# Patient Record
Sex: Female | Born: 2000 | Race: White | Hispanic: No | Marital: Single | Smoking: Never smoker
Health system: Southern US, Community
[De-identification: ages and names within clinical notes are randomized; demographics above are authoritative.]

## PROBLEM LIST (undated history)

## (undated) ENCOUNTER — Ambulatory Visit: Disposition: A | Discharge status: Home / Self Care | Payer: Self-pay

## (undated) DIAGNOSIS — Z803 Family history of malignant neoplasm of breast: Secondary | ICD-10-CM

## (undated) DIAGNOSIS — Z800 Family history of malignant neoplasm of digestive organs: Secondary | ICD-10-CM

## (undated) DIAGNOSIS — Z91018 Allergy to other foods: Secondary | ICD-10-CM

## (undated) DIAGNOSIS — Z808 Family history of malignant neoplasm of other organs or systems: Secondary | ICD-10-CM

## (undated) HISTORY — DX: Family history of malignant neoplasm of other organs or systems: Z80.8

## (undated) HISTORY — DX: Family history of malignant neoplasm of breast: Z80.3

## (undated) HISTORY — DX: Family history of malignant neoplasm of digestive organs: Z80.0

---
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
## 2020-06-19 ENCOUNTER — Encounter (INDEPENDENT_AMBULATORY_CARE_PROVIDER_SITE_OTHER): Payer: Self-pay

## 2021-01-13 ENCOUNTER — Other Ambulatory Visit: Payer: Self-pay | Admitting: Family Medicine

## 2021-01-13 DIAGNOSIS — N6310 Unspecified lump in the right breast, unspecified quadrant: Secondary | ICD-10-CM

## 2021-01-14 ENCOUNTER — Encounter: Payer: Self-pay | Admitting: Oncology

## 2021-02-04 ENCOUNTER — Inpatient Hospital Stay: Payer: PPO | Attending: Genetic Counselor | Admitting: Genetic Counselor

## 2021-02-04 ENCOUNTER — Other Ambulatory Visit: Payer: Self-pay | Admitting: Genetic Counselor

## 2021-02-04 ENCOUNTER — Other Ambulatory Visit: Payer: Self-pay

## 2021-02-04 ENCOUNTER — Inpatient Hospital Stay: Payer: PPO

## 2021-02-04 ENCOUNTER — Encounter: Payer: Self-pay | Admitting: Genetic Counselor

## 2021-02-04 DIAGNOSIS — Z803 Family history of malignant neoplasm of breast: Secondary | ICD-10-CM | POA: Diagnosis not present

## 2021-02-04 DIAGNOSIS — Z800 Family history of malignant neoplasm of digestive organs: Secondary | ICD-10-CM | POA: Diagnosis not present

## 2021-02-04 DIAGNOSIS — Z808 Family history of malignant neoplasm of other organs or systems: Secondary | ICD-10-CM | POA: Diagnosis not present

## 2021-02-04 NOTE — Progress Notes (Addendum)
REFERRING PROVIDER: Health, [facility] [address] [place],  [place] [address]  PRIMARY PROVIDER:  Health, [facility]  PRIMARY REASON FOR VISIT:  1. Family history of breast cancer   2. Family history of colon cancer   3. Family history of brain cancer      HISTORY OF PRESENT ILLNESS:   Toni Marks, a 20 y.o. 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  Toni Marks presents to clinic today to discuss the possibility of a hereditary predisposition to cancer, genetic testing, and to further clarify her future cancer risks, as well as potential cancer risks for family members.   Toni Marks is a 20 y.o. female with no personal history of cancer.  She felt a lump in her breast this year, and an ultrasound was performed.  It was determined to be a benign lump, but this concerned her enough to get tested.  CANCER HISTORY:  Oncology History   No history exists.     RISK FACTORS:  Menarche was at age 2.  First live birth at age N/A.  OCP use for approximately 0 years.  Ovaries intact: yes.  Hysterectomy: no.  Menopausal status: premenopausal.  HRT use: 0 years. Colonoscopy: no; not examined. Mammogram within the last year: no. Number of breast biopsies: 0. Up to date with pelvic exams: no. Any excessive radiation exposure in the past: no  Past Medical History:  Diagnosis Date   Family history of brain cancer    Family history of breast cancer    Family history of colon cancer    Multiple food allergies     No past surgical history on file.  Social History   Socioeconomic History   Marital status: Single    Spouse name: Not on file   Number of children: Not on file   Years of education: Not on file   Highest education level: Not on file  Occupational History   Not on file  Tobacco  Use   Smoking status: Never   Smokeless tobacco: Never  Substance and Sexual Activity   Alcohol use: No   Drug use: No   Sexual activity: Not on file  Other Topics Concern   Not on file  Social History Narrative   Not on file   Social Determinants of Health   Financial Resource Strain: Not on file  Food Insecurity: Not on file  Transportation Needs: Not on file  Physical Activity: Not on file  Stress: Not on file  Social Connections: Not on file     FAMILY HISTORY:  We obtained a detailed, 4-generation family history.  Significant diagnoses are listed below: Family History  Problem Relation Age of Onset   Breast cancer Mother 75       second cancer at 13   BRCA 1/2 Mother        BRCA2 positive   Asthma Sister    Brain cancer Sister 64   Heart disease Maternal Grandmother    Hypertension Maternal Grandmother    Hyperlipidemia Maternal Grandmother    Heart disease Maternal Grandfather    Hypertension Maternal Grandfather    Hyperlipidemia Maternal Grandfather    Colon cancer Maternal Grandfather 76   Diabetes Paternal Grandmother    Heart disease Paternal Grandmother    Lung cancer Paternal Grandmother    Heart disease Paternal  Grandfather     The patient does not have children.  She has two sisters and a brother. One sister had a cancer in her hypothalamus.  Both parents are living.  The patient's mother had breast cancer at 67 and 70.  She tested positive for a BRCA2 mutation.  She has a brother and sister set of twins who are cancer free.  The maternal grandmother is living and the grandfather is deceased from colon cancer.  The patient's father is living.  He has four sisters and a brother who are cancer free. The paternal grandparents are deceased. The grandmother had lung cancer and the grandfather died in his 56's.  Toni Marks is aware of previous family history of genetic testing for hereditary cancer risks. Patient's maternal ancestors are of Caucasian  descent, and paternal ancestors are of [place] descent. There is no reported Ashkenazi Jewish ancestry. There is no known consanguinity.  GENETIC COUNSELING ASSESSMENT: Toni Marks is a 20 y.o. female with a family history of breast cancer which is somewhat suggestive of a hereditary cancer syndrome and predisposition to cancer given the young age of onset of breast cancer and the known BRCA2 mutation. We, therefore, discussed and recommended the following at today's visit.   DISCUSSION: We discussed that, in general, most cancer is not inherited in families, but instead is sporadic or familial. Sporadic cancers occur by chance and typically happen at older ages (>50 years) as this type of cancer is caused by genetic changes acquired during an individuals lifetime. Some families have more cancers than would be expected by chance; however, the ages or types of cancer are not consistent with a known genetic mutation or known genetic mutations have been ruled out. This type of familial cancer is thought to be due to a combination of multiple genetic, environmental, hormonal, and lifestyle factors. While this combination of factors likely increases the risk of cancer, the exact source of this risk is not currently identifiable or testable.  We discussed that 5 - 10% of breast cancer is hereditary, with most cases associated with BRCA mutations.  The patient's mother has a BRCA2 mutation, therefore the patient has a 50% chance of testing positive.  We discussed that we can offer testing for hereditary genes associated with young brain tumors such as that found in her sister.  We discussed that testing is beneficial for several reasons including knowing how to follow individuals who test positive, and understand if other family members could be at risk for cancer and allow them to undergo genetic testing.   We reviewed the characteristics, features and inheritance patterns of hereditary cancer syndromes. We  also discussed genetic testing, including the appropriate family members to test, the process of testing, insurance coverage and turn-around-time for results. We discussed the implications of a negative, positive, carrier and/or variant of uncertain significant result. We recommended Ms. Wiedeman pursue genetic testing for the Multi-cancer gene panel.   The Multi-Gene Panel offered by [facility] includes sequencing and/or deletion duplication testing of the following 84 genes: AIP, ALK, APC, ATM, AXIN2,BAP1,  BARD1, BLM, BMPR1A, BRCA1, BRCA2, BRIP1, CASR, CDC73, CDH1, CDK4, CDKN1B, CDKN1C, CDKN2A (p14ARF), CDKN2A (p16INK4a), CEBPA, CHEK2, CTNNA1, DICER1, DIS3L2, EGFR (c.2369C>T, p.Thr790Met variant only), EPCAM (Deletion/duplication testing only), FH, FLCN, GATA2, GPC3, GREM1 (Promoter region deletion/duplication testing only), HOXB13 (c.251G>A, p.Gly84Glu), HRAS, KIT, MAX, MEN1, MET, MITF (c.952G>A, p.Glu318Lys variant only), MLH1, MSH2, MSH3, MSH6, MUTYH, NBN, NF1, NF2, NTHL1, PALB2, PDGFRA, PHOX2B, PMS2, POLD1, POLE, POT1, PRKAR1A, PTCH1, PTEN, RAD50, RAD51C,  RAD51D, RB1, RECQL4, RET, RUNX1, SDHAF2, SDHA (sequence changes only), SDHB, SDHC, SDHD, SMAD4, SMARCA4, SMARCB1, SMARCE1, STK11, SUFU, TERC, TERT, TMEM127, TP53, TSC1, TSC2, VHL, WRN and WT1.    We discussed that some people do not want to undergo genetic testing due to fear of genetic discrimination.  A federal law called the Genetic Information Non-Discrimination Act (GINA) of 2008 helps protect individuals against genetic discrimination based on their genetic test results.  It impacts both health insurance and employment.  With health insurance, it protects against increased premiums, being kicked off insurance or being forced to take a test in order to be insured.  For employment it protects against hiring, firing and promoting decisions based on genetic test results.  Health status due to a cancer diagnosis is not protected under GINA.   Based on  Ms. Barstow's family history of cancer, she meets medical criteria for genetic testing. Despite that she meets criteria, she may still have an out of pocket cost. We discussed that if her out of pocket cost for testing is over $100, the laboratory will call and confirm whether she wants to proceed with testing.  If the out of pocket cost of testing is less than $100 she will be billed by the genetic testing laboratory.   PLAN: After considering the risks, benefits, and limitations, Ms. Mastrianni provided informed consent to pursue genetic testing and the blood sample was sent to [facility] for analysis of the Multi-cancer panel. Results should be available within approximately 2-3 weeks' time, at which point they will be disclosed by telephone to Ms. Vanhorne, as will any additional recommendations warranted by these results. Ms. Yeung will receive a summary of her genetic counseling visit and a copy of her results once available. This information will also be available in Epic.   Lastly, we encouraged Ms. Youkhana to remain in contact with cancer genetics annually so that we can continuously update the family history and inform her of any changes in cancer genetics and testing that may be of benefit for this family.   Ms. Kinkade questions were answered to her satisfaction today. Our contact information was provided should additional questions or concerns arise. Thank you for the referral and allowing [place] to share in the care of your patient.   Tasmine Hipwell P. Florene Glen, [place], [facility] Licensed, Insurance risk surveyor [email or web address] phone: [phone]  The patient was seen for a total of 30 minutes in face-to-face genetic counseling.  The patient was seen alone.  This patient was discussed with Drs. Magrinat, Lindi Adie and/or Burr Medico who agrees with the above.    _______________________________________________________________________ For Office Staff:  Number of people  involved in session: 1 Was an Intern/ student involved with case: no

## 2021-02-05 LAB — GENETIC SCREENING ORDER

## 2021-03-12 ENCOUNTER — Encounter: Payer: Self-pay | Admitting: Genetic Counselor

## 2021-03-12 ENCOUNTER — Telehealth: Payer: Self-pay | Admitting: Genetic Counselor

## 2021-03-12 ENCOUNTER — Ambulatory Visit: Payer: Self-pay | Admitting: Genetic Counselor

## 2021-03-12 DIAGNOSIS — Z1379 Encounter for other screening for genetic and chromosomal anomalies: Secondary | ICD-10-CM | POA: Insufficient documentation

## 2021-03-12 NOTE — Progress Notes (Signed)
HPI:  [name] was previously seen in the [facility] clinic due to a family history of cancer, a known BRCA2 mutation and concerns regarding a hereditary predisposition to cancer. Please refer to our prior cancer genetics clinic note for more information regarding our discussion, assessment and recommendations, at the time. [name] recent genetic test results were disclosed to her, as were recommendations warranted by these results. These results and recommendations are discussed in more detail below.  CANCER HISTORY:  Oncology History   No history exists.    FAMILY HISTORY:  We obtained a detailed, 4-generation family history.  Significant diagnoses are listed below: Family History  Problem Relation Age of Onset   Breast cancer Mother 59       second cancer at 50   BRCA 1/2 Mother        BRCA2 positive   Asthma Sister    Brain cancer Sister 49   Heart disease Maternal Grandmother    Hypertension Maternal Grandmother    Hyperlipidemia Maternal Grandmother    Heart disease Maternal Grandfather    Hypertension Maternal Grandfather    Hyperlipidemia Maternal Grandfather    Colon cancer Maternal Grandfather 76   Diabetes Paternal Grandmother    Heart disease Paternal Grandmother    Lung cancer Paternal Grandmother    Heart disease Paternal Grandfather       The patient does not have children.  She has two sisters and a brother. One sister had a cancer in her hypothalamus.  Both parents are living.   The patient's mother had breast cancer at 28 and 39.  She tested positive for a BRCA2 mutation.  She has a brother and sister set of twins who are cancer free.  The maternal grandmother is living and the grandfather is deceased from colon cancer.   The patient's father is living.  He has four sisters and a brother who are cancer free. The paternal grandparents are deceased. The grandmother had lung cancer and the grandfather died in his 31's.   [name] is  aware of previous family history of genetic testing for hereditary cancer risks. Patient's maternal ancestors are of Caucasian descent, and paternal ancestors are of [place] descent. There is no reported Ashkenazi Jewish ancestry. There is no known consanguinity  GENETIC TEST RESULTS: Genetic testing reported out on March 12, 2021 through the multi-cancer panel+RNA found no pathogenic mutations. [name] test was normal and did not reveal the familial mutation. We call this result a true negative result because the cancer-causing mutation was identified in [name]'s family, and she did not inherit it.  Given this negative result, [name] chances of developing BRCA2-related cancers are the same as they are in the general population.  The Multi-Gene Panel offered by [facility] includes sequencing and/or deletion duplication testing of the following 84 genes: AIP, ALK, APC, ATM, AXIN2,BAP1,  BARD1, BLM, BMPR1A, BRCA1, BRCA2, BRIP1, CASR, CDC73, CDH1, CDK4, CDKN1B, CDKN1C, CDKN2A (p14ARF), CDKN2A (p16INK4a), CEBPA, CHEK2, CTNNA1, DICER1, DIS3L2, EGFR (c.2369C>T, p.Thr790Met variant only), EPCAM (Deletion/duplication testing only), FH, FLCN, GATA2, GPC3, GREM1 (Promoter region deletion/duplication testing only), HOXB13 (c.251G>A, p.Gly84Glu), HRAS, KIT, MAX, MEN1, MET, MITF (c.952G>A, p.Glu318Lys variant only), MLH1, MSH2, MSH3, MSH6, MUTYH, NBN, NF1, NF2, NTHL1, PALB2, PDGFRA, PHOX2B, PMS2, POLD1, POLE, POT1, PRKAR1A, PTCH1, PTEN, RAD50, RAD51C, RAD51D, RB1, RECQL4, RET, RUNX1, SDHAF2, SDHA (sequence changes only), SDHB, SDHC, SDHD, SMAD4, SMARCA4, SMARCB1, SMARCE1, STK11, SUFU, TERC, TERT, TMEM127, TP53, TSC1, TSC2, VHL, WRN and WT1. The test report  has been scanned into EPIC and is located under the Molecular Pathology section of the Results Review tab.  A portion of the result report is included below for reference.     We discussed with [name] that because current genetic testing is not  perfect, it is possible there may be a gene mutation in one of these genes that current testing cannot detect, but that chance is small.  We also discussed, that there could be another gene that has not yet been discovered, or that we have not yet tested, that is responsible for the cancer diagnoses in the family. It is also possible there is a hereditary cause for the cancer in the family that [name] did not inherit and therefore was not identified in her testing.  Therefore, it is important to remain in touch with cancer genetics in the future so that we can continue to offer [name] the most up to date genetic testing.   Genetic testing did identify five Variants of uncertain significance (VUS) - one in the DIS3L2 gene called Gain (Exons 15-21) CN=3, a second in the DIS3L2 gene called c.2221G>A, a third in the MSH2 gene called c.2050G>T, a fourth in the SMARCB1 gene called GAIN (Exons 8-9) CN=3, and a fifth in the TMEM127 gene called c.121A>G.  At this time, it is unknown if these variants are associated with increased cancer risk or if they are normal findings, but most variants such as these get reclassified to being inconsequential. They should not be used to make medical management decisions. With time, we suspect the lab will determine the significance of these variants, if any. If we do learn more about them, we will try to contact [name] to discuss it further. However, it is important to stay in touch with [place] periodically and keep the address and phone number up to date.  ADDITIONAL GENETIC TESTING: We discussed with [name] that her genetic testing was fairly extensive.  If there are genes identified to increase cancer risk that can be analyzed in the future, we would be happy to discuss and coordinate this testing at that time.    CANCER SCREENING RECOMMENDATIONS: [name] test result is considered negative (normal).  This means that we have not identified a hereditary  cause for her family history of cancer at this time. Most cancers happen by chance and this negative test suggests that her cancer may fall into this category.    While reassuring, this does not definitively rule out a hereditary predisposition to cancer. It is still possible that there could be genetic mutations that are undetectable by current technology. There could be genetic mutations in genes that have not been tested or identified to increase cancer risk.  Therefore, it is recommended she continue to follow the cancer management and screening guidelines provided by her primary healthcare provider.   An individual's cancer risk and medical management are not determined by genetic test results alone. Overall cancer risk assessment incorporates additional factors, including personal medical history, family history, and any available genetic information that may result in a personalized plan for cancer prevention and surveillance  RECOMMENDATIONS FOR FAMILY MEMBERS:  Individuals in this family might be at some increased risk of developing cancer, over the general population risk, simply due to the family history of cancer.  We recommended women in this family have a yearly mammogram beginning at age 78, or 5 years younger than the earliest onset of cancer, an annual clinical breast exam,  and perform monthly breast self-exams. Women in this family should also have a gynecological exam as recommended by their primary provider. All family members should be referred for colonoscopy starting at age 58.  FOLLOW-UP: Lastly, we discussed with [name] that cancer genetics is a rapidly advancing field and it is possible that new genetic tests will be appropriate for her and/or her family members in the future. We encouraged her to remain in contact with cancer genetics on an annual basis so we can update her personal and family histories and let her know of advances in cancer genetics that may benefit this  family.   Our contact number was provided. [name] questions were answered to her satisfaction, and she knows she is welcome to call us at anytime with additional questions or concerns.   Roma Kayser, [place], [facility] Licensed, Certified Genetic Counselor [email or web address]

## 2021-03-12 NOTE — Telephone Encounter (Signed)
Revealed results to patient.  She does NOT have the BRCA2 pathogenic variant identified in her mother.  This is considered a true negative, as there is a known mutation in the family and she does not have it.  There are 5 VUS's.  These will not affect medical management.

## 2021-03-30 IMAGING — DX PORTABLE CHEST - 1 VIEW
1 series · 1 of 1 positions shown · non-contrast
Comparison: March 08, 2014

CLINICAL DATA: Seizure

EXAM:
PORTABLE CHEST 1 VIEW

[chest ap]
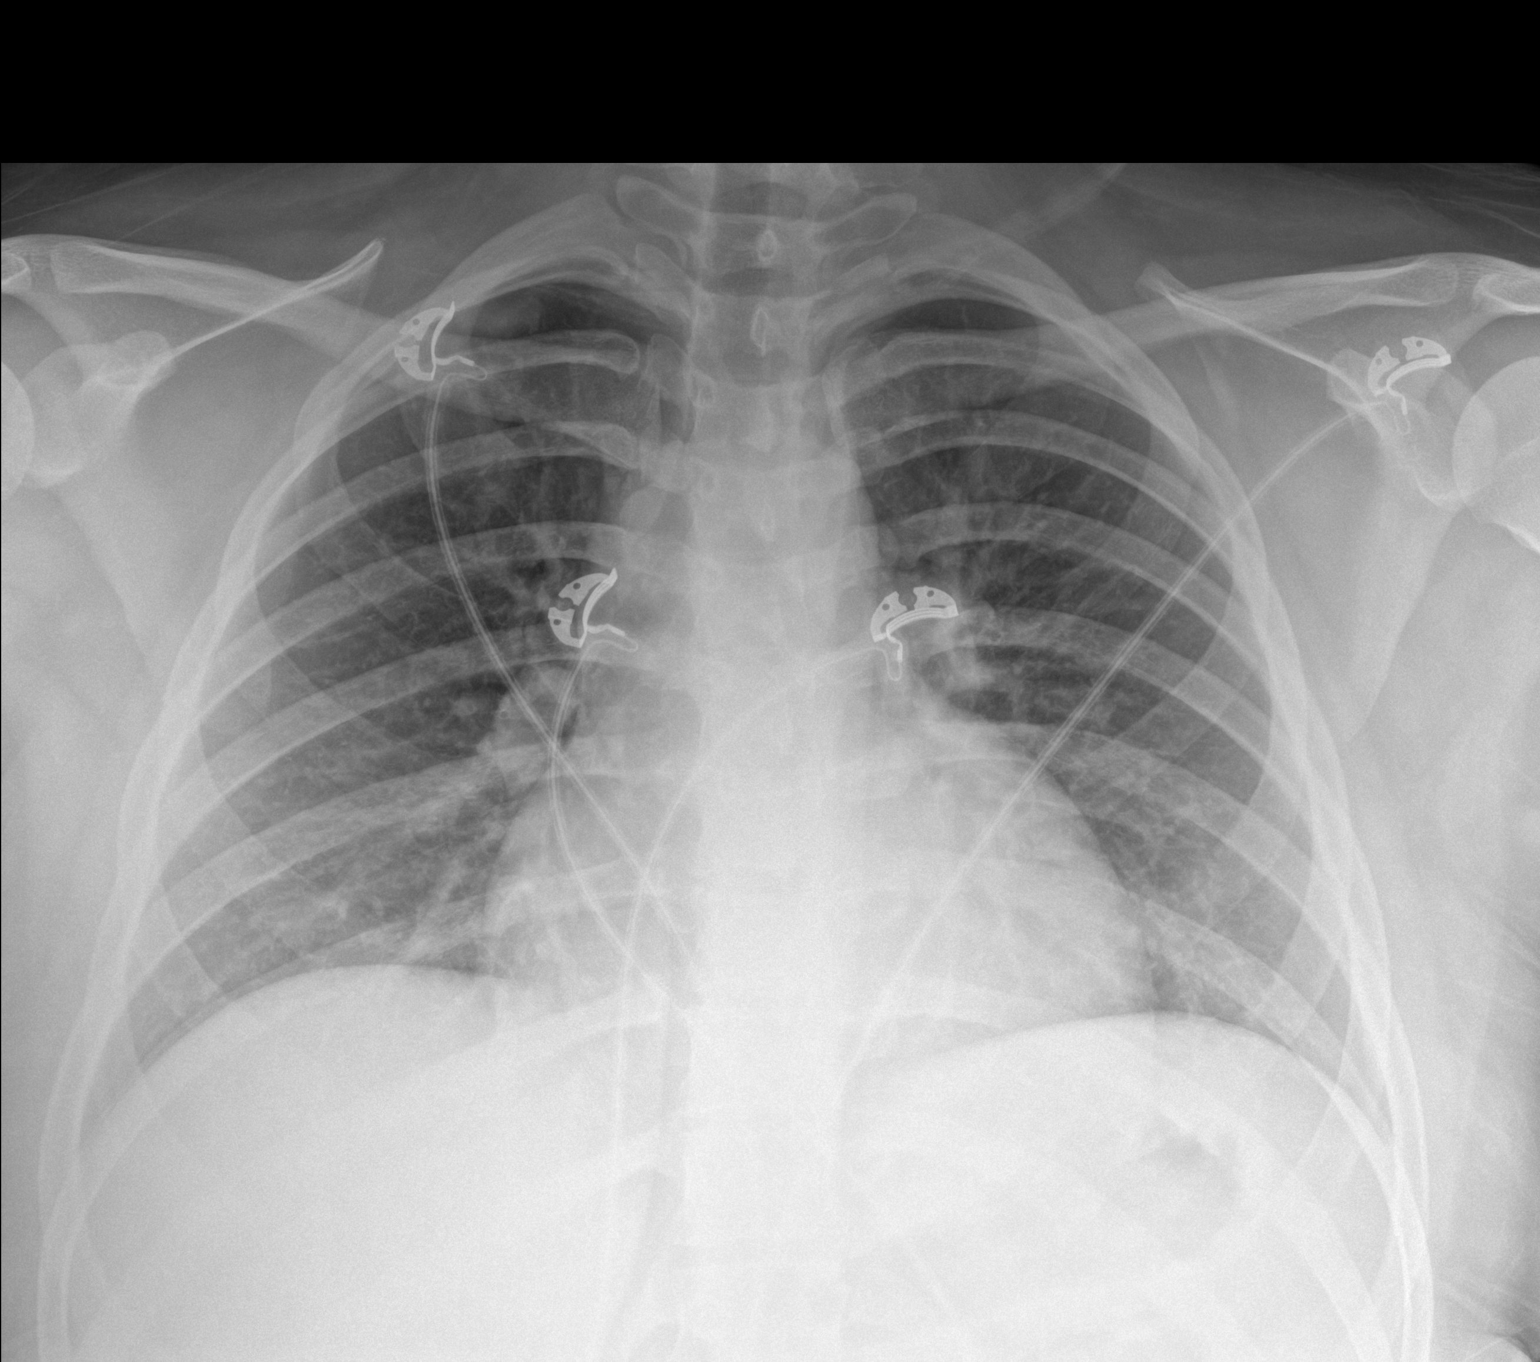

[1 of 1 positions shown; findings below may reference images not displayed]

FINDINGS: The heart size is mildly enlarged. There is no pneumothorax. No
large pleural effusion. There is no focal infiltrate.
IMPRESSION: No active disease.  There is mild cardiac enlargement

## 2021-03-30 IMAGING — CT CT HEAD WITHOUT CONTRAST
4 of 5 series · 17 of 47 positions shown, 19 images · non-contrast
Comparison: None.

CLINICAL DATA: Seizure, new, nontraumatic, 18-40 yrs

EXAM:
CT HEAD WITHOUT CONTRAST
TECHNIQUE: Contiguous axial images were obtained from the base of the skull
through the vertex without intravenous contrast.

[Series 2: head bone · axial · 0.41mm/px · z∈[-96,-30]mm · 5 of 74 slices shown]
[im 8/74  bone]
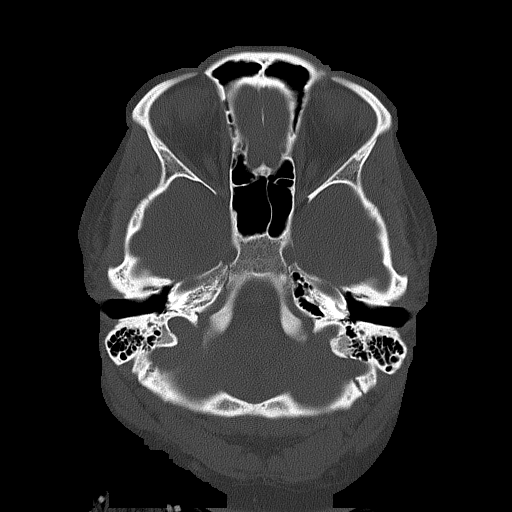
[im 15/74  bone]
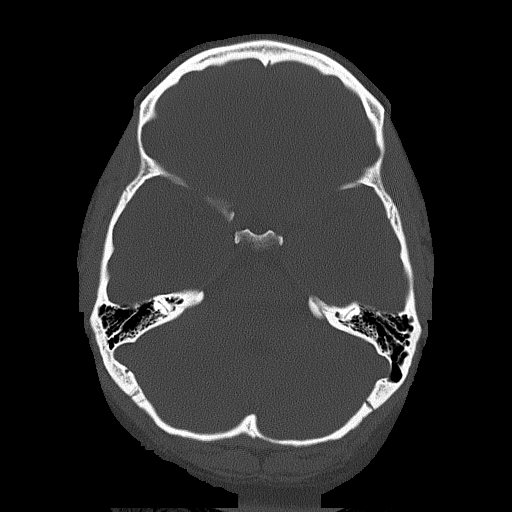
[im 22/74  bone]
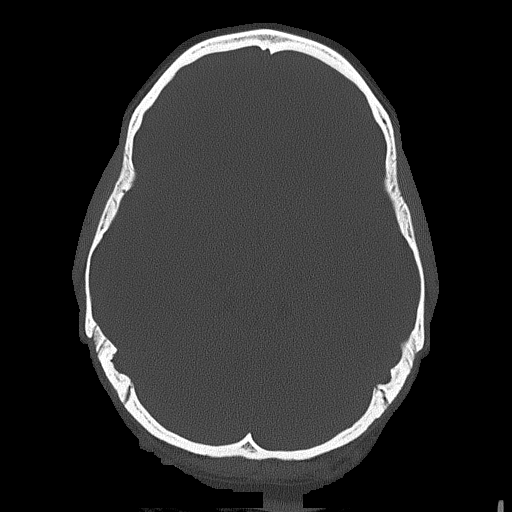
[im 33/74  bone]
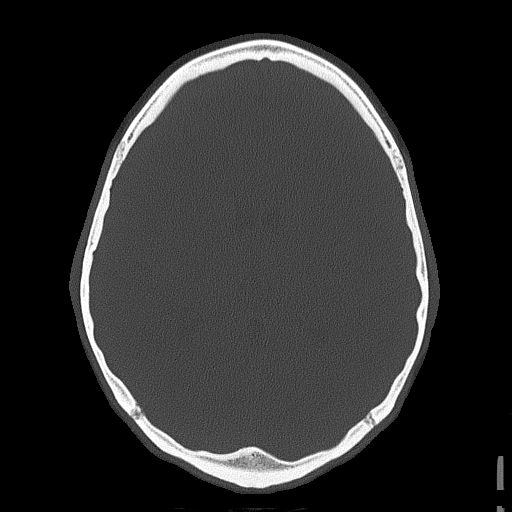
[im 41/74  bone]
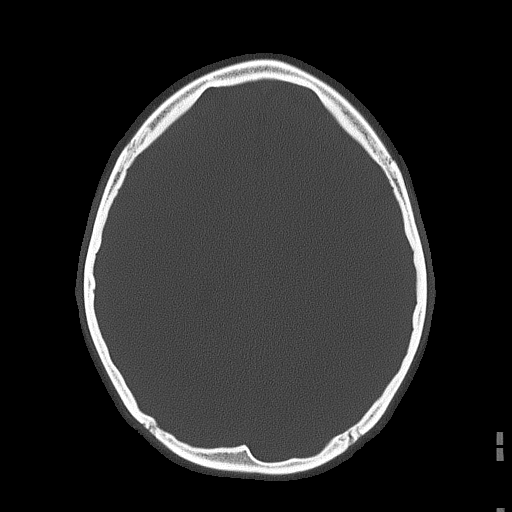

[Series 3: head wo · axial · 0.41mm/px · z∈[-95,+15]mm · 7 of 30 slices shown, 9 images]
[im 4/30  brain]
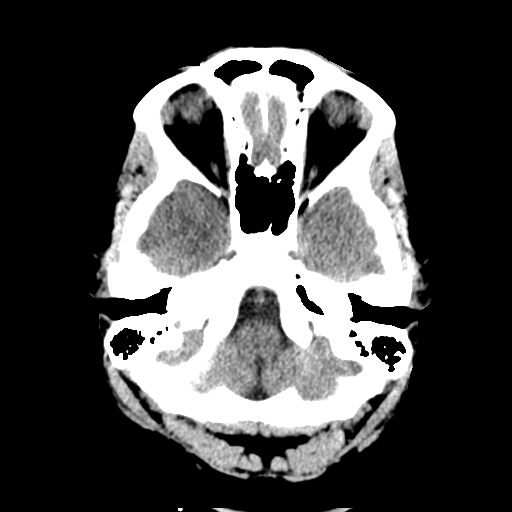
[im 4/30  bone]
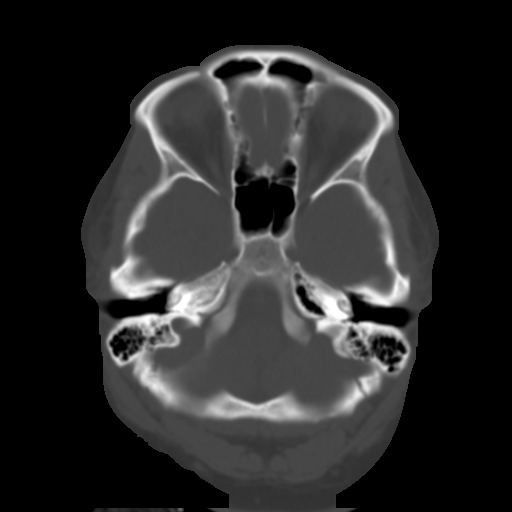
[im 8/30  brain]
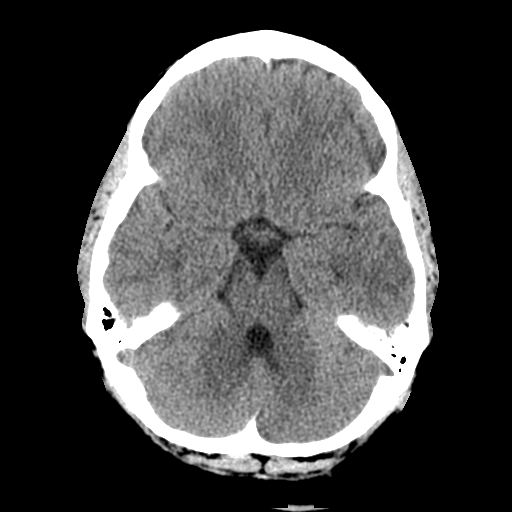
[im 11/30  brain]
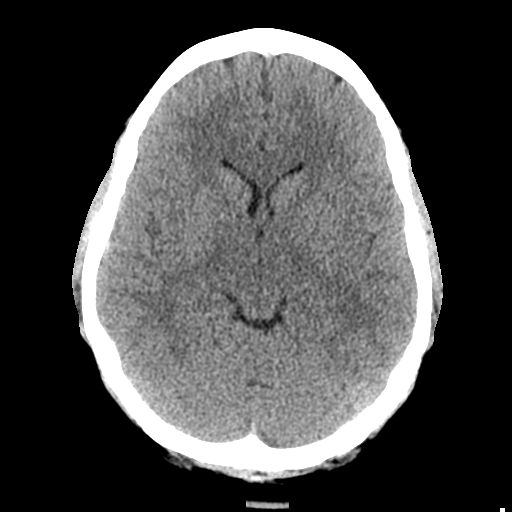
[im 15/30  brain]
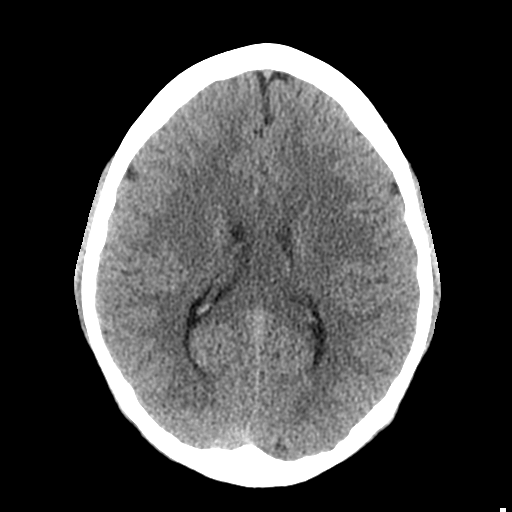
[im 19/30  brain]
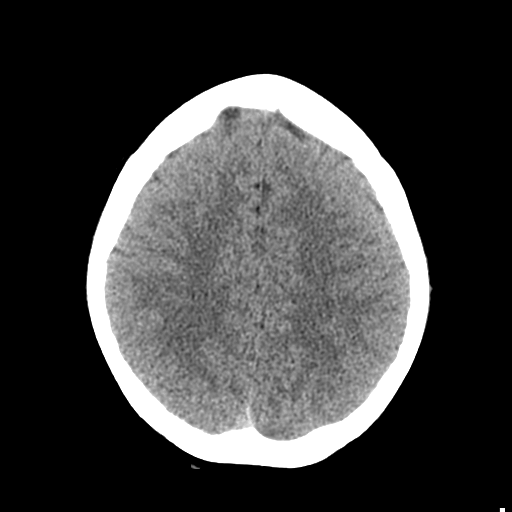
[im 19/30  bone]
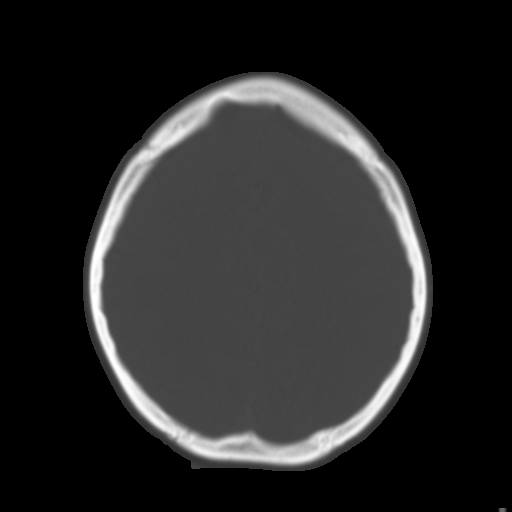
[im 22/30  brain]
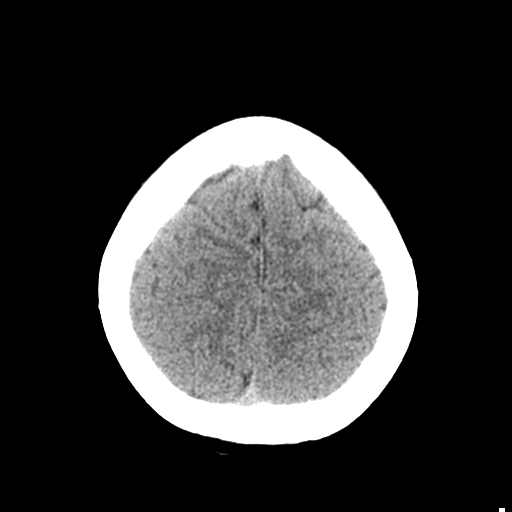
[im 26/30  brain]
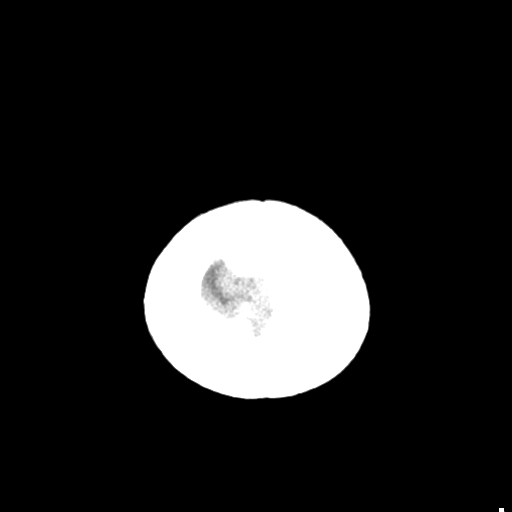

[Series 4: coronal soft tissue · coronal · 0.29mm/px · 3 of 64 slices shown]
[im 22/64  brain]
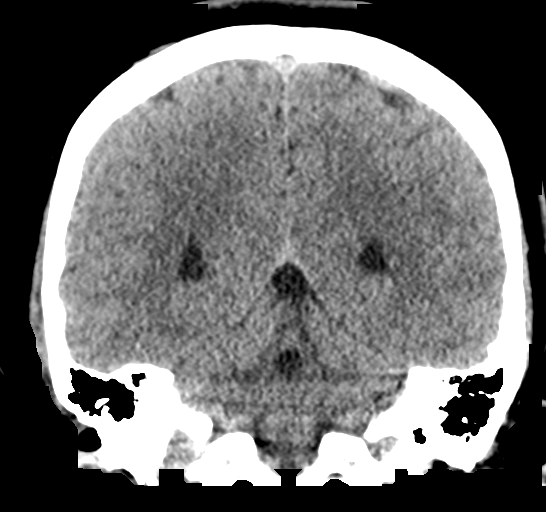
[im 29/64  brain]
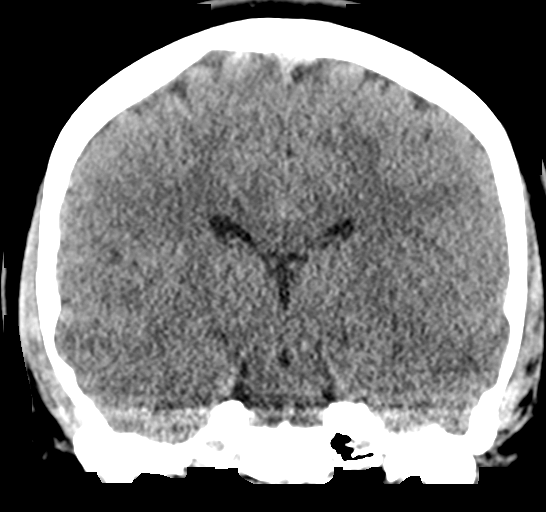
[im 36/64  brain]
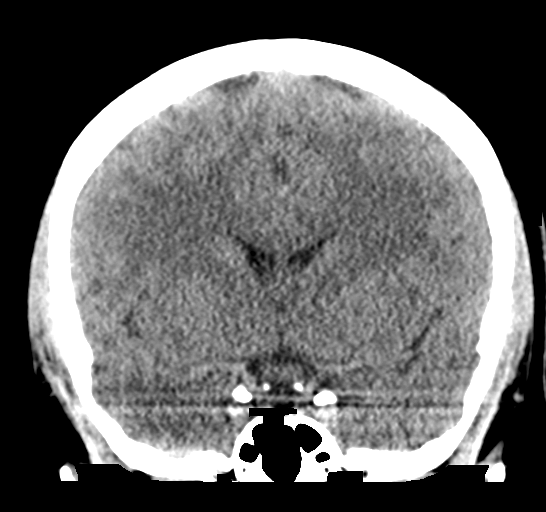

[Series 6: sagittal soft tissue- · sagittal · 0.29mm/px · 2 of 54 slices shown]
[im 18/54  brain]
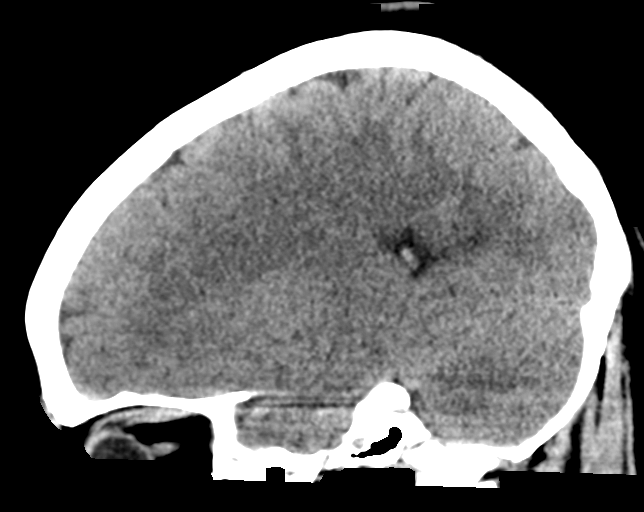
[im 36/54  brain]
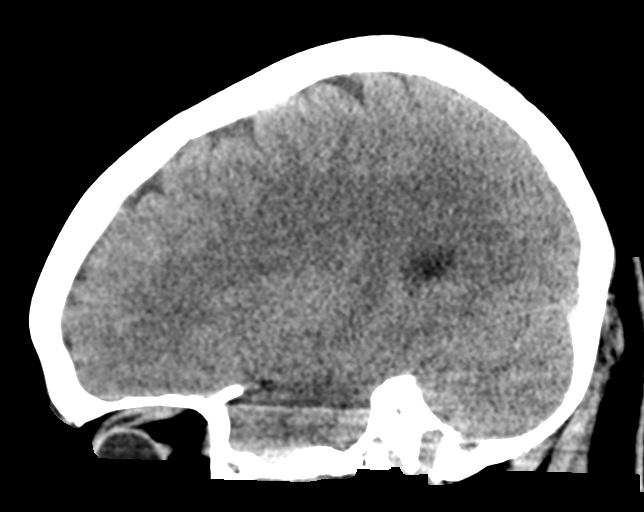

[17 of 47 positions shown; findings below may reference images not displayed]

FINDINGS: Brain: No evidence of acute infarction, hemorrhage, hydrocephalus,
extra-axial collection or mass lesion/mass effect.

Vascular: No hyperdense vessel.

Skull: Normal. Negative for fracture or focal lesion.

Sinuses/Orbits: Paranasal sinuses and mastoid air cells are clear.
The visualized orbits are unremarkable.

Other: None.
IMPRESSION: Negative head CT.

## 2021-12-05 DIAGNOSIS — X58XXXA Exposure to other specified factors, initial encounter: Secondary | ICD-10-CM | POA: Insufficient documentation

## 2021-12-05 DIAGNOSIS — T192XXA Foreign body in vulva and vagina, initial encounter: Secondary | ICD-10-CM | POA: Diagnosis present

## 2021-12-05 DIAGNOSIS — Z91010 Allergy to peanuts: Secondary | ICD-10-CM | POA: Diagnosis not present

## 2021-12-05 DIAGNOSIS — Z0000 Encounter for general adult medical examination without abnormal findings: Secondary | ICD-10-CM | POA: Diagnosis not present

## 2021-12-06 ENCOUNTER — Emergency Department (HOSPITAL_BASED_OUTPATIENT_CLINIC_OR_DEPARTMENT_OTHER)
Admission: EM | Admit: 2021-12-06 | Discharge: 2021-12-06 | Disposition: A | Discharge status: Home / Self Care | Payer: No Typology Code available for payment source | Attending: Emergency Medicine | Admitting: Emergency Medicine

## 2021-12-06 ENCOUNTER — Other Ambulatory Visit: Payer: Self-pay

## 2021-12-06 ENCOUNTER — Encounter (HOSPITAL_BASED_OUTPATIENT_CLINIC_OR_DEPARTMENT_OTHER): Payer: Self-pay

## 2021-12-06 DIAGNOSIS — Z0000 Encounter for general adult medical examination without abnormal findings: Secondary | ICD-10-CM

## 2021-12-06 NOTE — ED Triage Notes (Signed)
Suspects tampon stuck in vagina. Went to change it and could not find it. Sts lower abdominal cramping which is abnormal for her.

## 2021-12-06 NOTE — ED Provider Notes (Signed)
[facility] EMERGENCY DEPARTMENT Provider Note   CSN: [id number] Arrival date & time: 12/05/21  2354     History  Chief Complaint  Patient presents with   Foreign Body in Vagina    Toni Marks is a 21 y.o. female.  The history is provided by the patient.  Foreign Body in Vagina This is a new problem. The current episode started 3 to 5 hours ago. The problem occurs constantly. The problem has not changed since onset.Pertinent negatives include no chest pain, no abdominal pain, no headaches and no shortness of breath. Nothing aggravates the symptoms. Nothing relieves the symptoms. She has tried nothing for the symptoms. The treatment provided no relief.  Patient believes there is a tampon in her vagina as she went to remove it and could not locate it.       Home Medications Prior to Admission medications   Medication Sig Start Date End Date Taking? Authorizing Provider  chlorpheniramine-HYDROcodone (TUSSIONEX PENNKINETIC ER) 10-8 MG/5ML SUER Take 5 mLs by mouth every 12 (twelve) hours as needed. 06/13/19   Molpus, John, MD      Allergies    Chocolate flavor, Milk protein, Other, Pea, and Peanut-containing drug products    Review of Systems   Review of Systems  Respiratory:  Negative for shortness of breath.   Cardiovascular:  Negative for chest pain.  Gastrointestinal:  Negative for abdominal pain.  Neurological:  Negative for headaches.  All other systems reviewed and are negative.   Physical Exam Updated Vital Signs BP 131/83   Pulse 95   Temp 98.2 F (36.8 C)   Resp 20   Ht 5\' 2"  (1.575 m)   Wt 77.1 kg   LMP 12/02/2021   SpO2 100%   BMI 31.09 kg/m  Physical Exam Vitals and nursing note reviewed. Exam conducted with a chaperone present.  Constitutional:      General: She is not in acute distress.    Appearance: Normal appearance. She is well-developed.  HENT:     Head: Normocephalic and atraumatic.     Nose: Nose normal.  Eyes:     Pupils:  Pupils are equal, round, and reactive to light.  Cardiovascular:     Rate and Rhythm: Normal rate and regular rhythm.     Pulses: Normal pulses.     Heart sounds: Normal heart sounds.  Pulmonary:     Effort: Pulmonary effort is normal. No respiratory distress.     Breath sounds: Normal breath sounds.  Abdominal:     General: Bowel sounds are normal. There is no distension.     Palpations: Abdomen is soft.     Tenderness: There is no abdominal tenderness. There is no guarding or rebound.  Genitourinary:    General: Normal vulva.     Vagina: No vaginal discharge.     Comments: No foreign body palpable of visible in the vaginal vault Musculoskeletal:        General: Normal range of motion.     Cervical back: Neck supple.  Skin:    General: Skin is warm and dry.     Capillary Refill: Capillary refill takes less than 2 seconds.     Findings: No erythema or rash.  Neurological:     General: No focal deficit present.     Mental Status: She is alert and oriented to person, place, and time.     Deep Tendon Reflexes: Reflexes normal.  Psychiatric:        Mood and Affect: Mood  normal.     ED Results / Procedures / Treatments   Labs (all labs ordered are listed, but only abnormal results are displayed) Labs Reviewed - No data to display  EKG None  Radiology No results found.  Procedures Procedures    Medications Ordered in ED Medications - No data to display  ED Course/ Medical Decision Making/ A&P                           Medical Decision Making Patient believes there is a tampon in her vagina since 4 pm   Amount and/or Complexity of Data Reviewed External Data Reviewed: notes.    Details: Previous notes reviewed   Risk Risk Details: No foreign body in the vagina on exam.  Stable for discharge with close follow up.      Final Clinical Impression(s) / ED Diagnoses Final diagnoses:  Normal exam   Return for intractable cough, coughing up blood, fevers > 100.4  unrelieved by medication, shortness of breath, intractable vomiting, chest pain, shortness of breath, weakness, numbness, changes in speech, facial asymmetry, abdominal pain, passing out, Inability to tolerate liquids or food, cough, altered mental status or any concerns. No signs of systemic illness or infection. The patient is nontoxic-appearing on exam and vital signs are within normal limits.  I have reviewed the triage vital signs and the nursing notes. Pertinent labs & imaging results that were available during my care of the patient were reviewed by me and considered in my medical decision making (see chart for details). After history, exam, and medical workup I feel the patient has been appropriately medically screened and is safe for discharge home. Pertinent diagnoses were discussed with the patient. Patient was given return precautions.  Rx / DC Orders ED Discharge Orders     None         Boen Sterbenz, MD 12/06/21 0998

## 2021-12-06 NOTE — ED Notes (Signed)
Patient refused d/c vitals, stating that "she just wants to get out of here."

## 2023-02-28 ENCOUNTER — Other Ambulatory Visit: Payer: Self-pay

## 2023-02-28 ENCOUNTER — Encounter (HOSPITAL_BASED_OUTPATIENT_CLINIC_OR_DEPARTMENT_OTHER): Payer: Self-pay | Admitting: Emergency Medicine

## 2023-02-28 DIAGNOSIS — R112 Nausea with vomiting, unspecified: Secondary | ICD-10-CM | POA: Insufficient documentation

## 2023-02-28 DIAGNOSIS — Z20822 Contact with and (suspected) exposure to covid-19: Secondary | ICD-10-CM | POA: Diagnosis not present

## 2023-02-28 DIAGNOSIS — Z91010 Allergy to peanuts: Secondary | ICD-10-CM | POA: Insufficient documentation

## 2023-02-28 LAB — RESP PANEL BY RT-PCR (RSV, FLU A&B, COVID)  RVPGX2
Influenza A by PCR: NEGATIVE
Influenza B by PCR: NEGATIVE
Resp Syncytial Virus by PCR: NEGATIVE
SARS Coronavirus 2 by RT PCR: NEGATIVE

## 2023-02-28 LAB — COMPREHENSIVE METABOLIC PANEL
ALT: 16 U/L (ref 0–44)
AST: 17 U/L (ref 15–41)
Albumin: 4.4 g/dL (ref 3.5–5.0)
Alkaline Phosphatase: 46 U/L (ref 38–126)
Anion gap: 11 (ref 5–15)
BUN: 20 mg/dL (ref 6–20)
CO2: 20 mmol/L — ABNORMAL LOW (ref 22–32)
Calcium: 9 mg/dL (ref 8.9–10.3)
Chloride: 102 mmol/L (ref 98–111)
Creatinine, Ser: 0.52 mg/dL (ref 0.44–1.00)
GFR, Estimated: >60 mL/min (ref >60)
Glucose, Bld: 123 mg/dL — ABNORMAL HIGH (ref 70–99)
Potassium: 3.5 mmol/L (ref 3.5–5.1)
Sodium: 133 mmol/L — ABNORMAL LOW (ref 135–145)
Total Bilirubin: 0.8 mg/dL (ref 0.0–1.2)
Total Protein: 7.5 g/dL (ref 6.5–8.1)

## 2023-02-28 LAB — CBC
HCT: 40.5 % (ref 36.0–46.0)
Hemoglobin: 12.7 g/dL (ref 12.0–15.0)
MCH: 23.6 pg — ABNORMAL LOW (ref 26.0–34.0)
MCHC: 31.4 g/dL (ref 30.0–36.0)
MCV: 75.1 fL — ABNORMAL LOW (ref 80.0–100.0)
Platelets: 198 10*3/uL (ref 150–400)
RBC: 5.39 MIL/uL — ABNORMAL HIGH (ref 3.87–5.11)
RDW: 14.6 % (ref 11.5–15.5)
WBC: 6.4 10*3/uL (ref 4.0–10.5)
nRBC: 0 % (ref 0.0–0.2)

## 2023-02-28 LAB — LIPASE, BLOOD: Lipase: 38 U/L (ref 11–51)

## 2023-02-28 NOTE — ED Triage Notes (Signed)
 Patient presents with nausea/vomiting since 1300. States "can't keep anything down". Reports taking zofran earlier tonight with no relief

## 2023-03-01 ENCOUNTER — Emergency Department (HOSPITAL_BASED_OUTPATIENT_CLINIC_OR_DEPARTMENT_OTHER)
Admission: EM | Admit: 2023-03-01 | Discharge: 2023-03-01 | Disposition: A | Discharge status: Home / Self Care | Payer: 59 | Attending: Emergency Medicine | Admitting: Emergency Medicine

## 2023-03-01 DIAGNOSIS — R112 Nausea with vomiting, unspecified: Secondary | ICD-10-CM

## 2023-03-01 LAB — HCG, QUANTITATIVE, PREGNANCY: hCG, Beta Chain, Quant, S: <1 m[IU]/mL (ref <5)

## 2023-03-01 MED ORDER — DROPERIDOL 2.5 MG/ML IJ SOLN
1.2500 mg | Freq: Once | INTRAMUSCULAR | Status: AC
  Administered 2023-03-01: 1.25 mg via INTRAVENOUS
  Filled 2023-03-01: qty 2

## 2023-03-01 MED ORDER — SODIUM CHLORIDE 0.9 % IV BOLUS
500.0000 mL | Freq: Once | INTRAVENOUS | Status: AC
  Administered 2023-03-01: 500 mL via INTRAVENOUS

## 2023-03-01 NOTE — ED Notes (Addendum)
 Marland Kitchen

## 2023-03-01 NOTE — ED Notes (Signed)
 Patient tolerating po challenge. Denies any further complaints n/v at this time. MD aware

## 2023-03-01 NOTE — ED Provider Notes (Signed)
 [facility] AT [facility] Provider Note   CSN: [id number] Arrival date & time: 02/28/23  2256     History  Chief Complaint  Patient presents with   Emesis    Toni Marks is a 23 y.o. female.  The history is provided by the patient and a parent.  Emesis Timing:  Intermittent Quality:  Stomach contents Progression since onset: still nauseated but none since being in the ED. Chronicity:  New Recent urination:  Normal Context: not post-tussive   Associated symptoms: no abdominal pain, no diarrhea, no fever and no URI   Risk factors: sick contacts   Risk factors comment:  Younger sibling with same      Home Medications Prior to Admission medications   Medication Sig Start Date End Date Taking? Authorizing Provider  chlorpheniramine-HYDROcodone  (TUSSIONEX PENNKINETIC ER) 10-8 MG/5ML SUER Take 5 mLs by mouth every 12 (twelve) hours as needed. 06/13/19   Molpus, John, MD      Allergies    Chocolate flavoring agent (non-screening), Milk protein, Other, Pea, and Peanut-containing drug products    Review of Systems   Review of Systems  Constitutional:  Negative for fever.  HENT:  Negative for facial swelling.   Eyes:  Negative for redness.  Respiratory:  Negative for wheezing and stridor.   Gastrointestinal:  Positive for vomiting. Negative for abdominal pain and diarrhea.  All other systems reviewed and are negative.   Physical Exam Updated Vital Signs BP 116/75 (BP Location: Left Arm)   Pulse 92   Temp 98 F (36.7 C)   Resp 18   Ht 5' 2 (1.575 m)   Wt 73.9 kg   LMP 02/18/2023 (Exact Date)   SpO2 100%   BMI 29.81 kg/m  Physical Exam Vitals and nursing note reviewed.  Constitutional:      General: She is not in acute distress.    Appearance: Normal appearance. She is well-developed.  HENT:     Head: Normocephalic and atraumatic.     Nose: Nose normal.  Eyes:     Pupils: Pupils are equal, round, and reactive to light.   Cardiovascular:     Rate and Rhythm: Normal rate and regular rhythm.     Pulses: Normal pulses.     Heart sounds: Normal heart sounds.  Pulmonary:     Effort: Pulmonary effort is normal. No respiratory distress.     Breath sounds: Normal breath sounds. No wheezing or rales.  Abdominal:     General: Abdomen is flat. Bowel sounds are normal. There is no distension.     Palpations: Abdomen is soft.     Tenderness: There is no abdominal tenderness. There is no guarding or rebound.     Hernia: No hernia is present.  Musculoskeletal:        General: Normal range of motion.     Cervical back: Neck supple.  Skin:    General: Skin is warm and dry.     Capillary Refill: Capillary refill takes less than 2 seconds.     Findings: No erythema or rash.  Neurological:     General: No focal deficit present.     Mental Status: She is alert and oriented to person, place, and time.     Deep Tendon Reflexes: Reflexes normal.  Psychiatric:        Mood and Affect: Mood normal.     ED Results / Procedures / Treatments   Labs (all labs ordered are listed, but only abnormal results  are displayed) Results for orders placed or performed during the hospital encounter of 03/01/23  Resp panel by RT-PCR (RSV, Flu A&B, Covid) Anterior Nasal Swab   Collection Time: 02/28/23 11:06 PM   Specimen: Anterior Nasal Swab  Result Value Ref Range   SARS Coronavirus 2 by RT PCR NEGATIVE NEGATIVE   Influenza A by PCR NEGATIVE NEGATIVE   Influenza B by PCR NEGATIVE NEGATIVE   Resp Syncytial Virus by PCR NEGATIVE NEGATIVE  Lipase, blood   Collection Time: 02/28/23 11:06 PM  Result Value Ref Range   Lipase 38 11 - 51 U/L  Comprehensive metabolic panel   Collection Time: 02/28/23 11:06 PM  Result Value Ref Range   Sodium 133 (L) 135 - 145 mmol/L   Potassium 3.5 3.5 - 5.1 mmol/L   Chloride 102 98 - 111 mmol/L   CO2 20 (L) 22 - 32 mmol/L   Glucose, Bld 123 (H) 70 - 99 mg/dL   BUN 20 6 - 20 mg/dL   Creatinine,  Ser 9.47 0.44 - 1.00 mg/dL   Calcium 9.0 8.9 - 89.6 mg/dL   Total Protein 7.5 6.5 - 8.1 g/dL   Albumin 4.4 3.5 - 5.0 g/dL   AST 17 15 - 41 U/L   ALT 16 0 - 44 U/L   Alkaline Phosphatase 46 38 - 126 U/L   Total Bilirubin 0.8 0.0 - 1.2 mg/dL   GFR, Estimated >39 >39 mL/min   Anion gap 11 5 - 15  CBC   Collection Time: 02/28/23 11:06 PM  Result Value Ref Range   WBC 6.4 4.0 - 10.5 K/uL   RBC 5.39 (H) 3.87 - 5.11 MIL/uL   Hemoglobin 12.7 12.0 - 15.0 g/dL   HCT 59.4 63.9 - 53.9 %   MCV 75.1 (L) 80.0 - 100.0 fL   MCH 23.6 (L) 26.0 - 34.0 pg   MCHC 31.4 30.0 - 36.0 g/dL   RDW 85.3 88.4 - 84.4 %   Platelets 198 150 - 400 K/uL   nRBC 0.0 0.0 - 0.2 %  hCG, quantitative, pregnancy   Collection Time: 02/28/23 11:06 PM  Result Value Ref Range   hCG, Beta Chain, Quant, S <1 <5 mIU/mL   No results found.   Radiology No results found.  Procedures Procedures    Medications Ordered in ED Medications  droperidol  (INAPSINE ) 2.5 MG/ML injection 1.25 mg (has no administration in time range)  sodium chloride  0.9 % bolus 500 mL (has no administration in time range)    ED Course/ Medical Decision Making/ A&P                                 Medical Decision Making Nausea and vomiting and affected siblings   Amount and/or Complexity of Data Reviewed Independent Historian: parent    Details: See above  External Data Reviewed: notes.    Details: Previous notes reviewed  Labs: ordered.    Details: Negative pregnancy test, negative covid and flu.  Normal white count 6.4, normal hemoglobin 12.7, normal platelet count, normal lipase 38, sodium 133, normal potassium 3.5, normal creatinine   Risk Prescription drug management. Risk Details: Making tears in the ED.  No signs of dehydration clinically: vitals, exam and labs are benign and reassuring.  This is consistent with viral illness given other family members with same.  I do not believe this patient requires imaging at this time.   Droperidol  given and IV bolus.  PO challenged in the ED.  Hygiene instructions given to prevent other family members from becoming ill.  RX for Zofran  ODT.  Work note provided.      Final Clinical Impression(s) / ED Diagnoses Final diagnoses:  Nausea and vomiting, unspecified vomiting type    I have reviewed the triage vital signs and the nursing notes. Pertinent labs & imaging results that were available during my care of the patient were reviewed by me and considered in my medical decision making (see chart for details). After history, exam, and medical workup I feel the patient has been appropriately medically screened and is safe for discharge home. Pertinent diagnoses were discussed with the patient. Patient was given return precautions.  Rx / DC Orders ED Discharge Orders     None         Treyshaun Keatts, MD 03/01/23 9677
